# Patient Record
Sex: Female | Born: 1975 | Race: Black or African American | Hispanic: No | Marital: Single | Smoking: Never smoker
Health system: Southern US, Community
[De-identification: ages and names within clinical notes are randomized; demographics above are authoritative.]

---

## 2016-11-13 ENCOUNTER — Encounter: Payer: Self-pay | Admitting: *Deleted

## 2016-11-13 ENCOUNTER — Emergency Department: Payer: 59

## 2016-11-13 ENCOUNTER — Emergency Department
Admission: EM | Admit: 2016-11-13 | Discharge: 2016-11-14 | Disposition: A | Discharge status: Home / Self Care | Payer: 59 | Attending: Emergency Medicine | Admitting: Emergency Medicine

## 2016-11-13 DIAGNOSIS — N390 Urinary tract infection, site not specified: Secondary | ICD-10-CM | POA: Insufficient documentation

## 2016-11-13 DIAGNOSIS — R1031 Right lower quadrant pain: Secondary | ICD-10-CM | POA: Insufficient documentation

## 2016-11-13 DIAGNOSIS — N200 Calculus of kidney: Secondary | ICD-10-CM | POA: Diagnosis not present

## 2016-11-13 DIAGNOSIS — R109 Unspecified abdominal pain: Secondary | ICD-10-CM

## 2016-11-13 LAB — COMPREHENSIVE METABOLIC PANEL
ALT: 12 U/L — ABNORMAL LOW (ref 14–54)
ANION GAP: 9 (ref 5–15)
AST: 16 U/L (ref 15–41)
Albumin: 3.9 g/dL (ref 3.5–5.0)
Alkaline Phosphatase: 93 U/L (ref 38–126)
BUN: 13 mg/dL (ref 6–20)
CHLORIDE: 105 mmol/L (ref 101–111)
CO2: 23 mmol/L (ref 22–32)
Calcium: 9 mg/dL (ref 8.9–10.3)
Creatinine, Ser: 0.64 mg/dL (ref 0.44–1.00)
GFR calc non Af Amer: >60 mL/min (ref >60)
Glucose, Bld: 87 mg/dL (ref 65–99)
POTASSIUM: 3.6 mmol/L (ref 3.5–5.1)
SODIUM: 137 mmol/L (ref 135–145)
TOTAL PROTEIN: 7.8 g/dL (ref 6.5–8.1)
Total Bilirubin: 0.7 mg/dL (ref 0.3–1.2)

## 2016-11-13 LAB — URINALYSIS, COMPLETE (UACMP) WITH MICROSCOPIC
BILIRUBIN URINE: NEGATIVE
GLUCOSE, UA: NEGATIVE mg/dL
Ketones, ur: NEGATIVE mg/dL
LEUKOCYTES UA: NEGATIVE
NITRITE: POSITIVE — AB
PROTEIN: 30 mg/dL — AB
SPECIFIC GRAVITY, URINE: 1.03 (ref 1.005–1.030)
pH: 5 (ref 5.0–8.0)

## 2016-11-13 LAB — CBC
HEMATOCRIT: 33.2 % — AB (ref 35.0–47.0)
HEMOGLOBIN: 11 g/dL — AB (ref 12.0–16.0)
MCH: 25.2 pg — AB (ref 26.0–34.0)
MCHC: 33.2 g/dL (ref 32.0–36.0)
MCV: 75.7 fL — ABNORMAL LOW (ref 80.0–100.0)
Platelets: 230 10*3/uL (ref 150–440)
RBC: 4.38 MIL/uL (ref 3.80–5.20)
RDW: 17.2 % — AB (ref 11.5–14.5)
WBC: 6.4 10*3/uL (ref 3.6–11.0)

## 2016-11-13 LAB — LIPASE, BLOOD: Lipase: 20 U/L (ref 11–51)

## 2016-11-13 LAB — POCT PREGNANCY, URINE: PREG TEST UR: NEGATIVE

## 2016-11-13 MED ORDER — CEPHALEXIN 500 MG PO CAPS
500.0000 mg | ORAL_CAPSULE | Freq: Once | ORAL | Status: AC
  Administered 2016-11-14: 500 mg via ORAL
  Filled 2016-11-13: qty 1

## 2016-11-13 MED ORDER — CEPHALEXIN 500 MG PO CAPS: 500.0000 mg | ORAL_CAPSULE | Freq: Two times a day (BID) | ORAL | 0 refills | Status: AC

## 2016-11-13 MED ORDER — DOCUSATE SODIUM 100 MG PO CAPS: ORAL_CAPSULE | ORAL | 0 refills | Status: AC

## 2016-11-13 MED ORDER — HYDROCODONE-ACETAMINOPHEN 5-325 MG PO TABS: 1.0000 | ORAL_TABLET | ORAL | 0 refills | Status: AC | PRN

## 2016-11-13 NOTE — ED Notes (Signed)
Lab at bedside to attempt to collect blood.

## 2016-11-13 NOTE — Discharge Instructions (Addendum)
You have been seen in the Emergency Department (ED) today for pain that we believe based on your workup, is caused by kidney stones.  As we have discussed, please drink plenty of fluids.  Please make a follow up appointment with the physician(s) listed elsewhere in this documentation. It is also very important that you complete your full course of antibiotics.  You may take pain medication as needed but ONLY as prescribed.  Please also take your prescribed Flomax daily.  We also recommend that you take over-the-counter ibuprofen regularly according to label instructions over the next 5 days.  Take it with meals to minimize stomach discomfort.  Please see your doctor as soon as possible as stones may take 1-3 weeks to pass and you may require additional care or medications.  Do not drink alcohol, drive or participate in any other potentially dangerous activities while taking opiate pain medication as it may make you sleepy. Do not take this medication with any other sedating medications, either prescription or over-the-counter. If you were prescribed Percocet or Vicodin, do not take these with acetaminophen (Tylenol) as it is already contained within these medications.   Take Norco as needed for severe pain.  This medication is an opiate (or narcotic) pain medication and can be habit forming.  Use it as little as possible to achieve adequate pain control.  Do not use or use it with extreme caution if you have a history of opiate abuse or dependence.  If you are on a pain contract with your primary care doctor or a pain specialist, be sure to let them know you were prescribed this medication today from the [facility].  This medication is intended for your use only - do not give any to anyone else and keep it in a secure place where nobody else, especially children, have access to it.  It will also cause or worsen constipation, so you may want to consider taking an over-the-counter  stool softener while you are taking this medication.  Return to the Emergency Department (ED) or call your doctor if you have any worsening pain, fever, painful urination, are unable to urinate, or develop other symptoms that concern you.

## 2016-11-13 NOTE — ED Triage Notes (Signed)
Pt to lobby via w/c with no distress noted, brought in by EMS; EMS reports pt was at work, sudden onset at 545pm and right lower abd pain accomp by N/V

## 2016-11-13 NOTE — ED Notes (Signed)
Pt states N&V & RLQ pain that began at work. States she hasn't vomiting since work. Pt states she still has all abdominal organs. Denies seeing any blood in vomit, stool, or urine. Alert, oriented, no distress noted at this time. Family member at bedside.

## 2016-11-13 NOTE — ED Provider Notes (Addendum)
[facility] Emergency Department Provider Note  ____________________________________________   First MD Initiated Contact with Patient 11/13/16 2200     (approximate)  I have reviewed the triage vital signs and the nursing notes.   HISTORY  Chief Complaint Abdominal Pain    HPI Meghan Spence is a 41 y.o. female without any chronic medical issueswho presents by private vehicle for evaluation of acute onset nausea and several episodes of vomiting with sharp cramping pain in her right lower quadrant.  She states that she was at work and in her usual state of health when the symptoms started very suddenly and were severe.  They waxed and waned over the next couple of hours.  They have currently resolved.  She has never experienced anything like it in the past.  She states that while it was present she cannot find a position of comfort, but now she feels fine.  She has not had any recent dysuria or hematuria and has no history of kidney stones.  Her last menstrual period was about 3 weeks ago and she has not had any vaginal bleeding.  She denies fever/chills, chest pain or shortness of breath, diarrhea.  She is currently in no acute distress.   No past medical history on file.  There are no active problems to display for this patient.   No past surgical history on file.  Prior to Admission medications   Medication Sig Start Date End Date Taking? Authorizing Provider  cephALEXin (KEFLEX) 500 MG capsule Take 1 capsule (500 mg total) by mouth 2 (two) times daily. 11/13/16   Loleta Rose, MD  docusate sodium (COLACE) 100 MG capsule Take 1 tablet once or twice daily as needed for constipation while taking narcotic pain medicine 11/13/16   Loleta Rose, MD  HYDROcodone-acetaminophen (NORCO/VICODIN) 5-325 MG tablet Take 1-2 tablets by mouth every 4 (four) hours as needed for moderate pain. 11/13/16   Loleta Rose, MD  ondansetron (ZOFRAN ODT) 4 MG disintegrating tablet  Allow 1-2 tablets to dissolve in your mouth every 8 hours as needed for nausea/vomiting 11/14/16   Loleta Rose, MD    Allergies Patient has no known allergies.  No family history on file.  Social History Social History  Substance Use Topics  . Smoking status: Never Smoker  . Smokeless tobacco: Never Used  . Alcohol use No    Review of Systems Constitutional: No fever/chills Eyes: No visual changes. ENT: No sore throat. Cardiovascular: Denies chest pain. Respiratory: Denies shortness of breath. Gastrointestinal: Acute onset severe right lower quadrant abdominal pain with several episodes of vomiting and nausea, all of which have now resolved.  No diarrhea.  No constipation. Genitourinary: Negative for dysuria. Musculoskeletal: Negative for neck pain.  Negative for back pain. Integumentary: Negative for rash. Neurological: Negative for headaches, focal weakness or numbness.   ____________________________________________   PHYSICAL EXAM:  VITAL SIGNS: ED Triage Vitals  Enc Vitals Group     BP 11/13/16 2037 137/81     Pulse Rate 11/13/16 2037 67     Resp 11/13/16 2037 20     Temp 11/13/16 2037 98.6 F (37 C)     Temp Source 11/13/16 2037 Oral     SpO2 11/13/16 2037 100 %     Weight 11/13/16 2037 118.8 kg (262 lb)     Height 11/13/16 2037 1.549 m (5\' 1" )     Head Circumference --      Peak Flow --      Pain Score 11/13/16  2036 5     Pain Loc --      Pain Edu? --      Excl. in GC? --     Constitutional: Alert and oriented. Well appearing and in no acute distress. Eyes: Conjunctivae are normal.  Head: Atraumatic. Nose: No congestion/rhinnorhea. Mouth/Throat: Mucous membranes are moist. Neck: No stridor.  No meningeal signs.   Cardiovascular: Normal rate, regular rhythm. Good peripheral circulation. Grossly normal heart sounds. Respiratory: Normal respiratory effort.  No retractions. Lungs CTAB. Gastrointestinal: Obese.  Soft with very minimal tenderness to  palpation of the right lower quadrant, no rebound and no guarding.  No CVA tenderness on either side. GU:  Deferred Musculoskeletal: No lower extremity tenderness nor edema. No gross deformities of extremities. Neurologic:  Normal speech and language. No gross focal neurologic deficits are appreciated.  Skin:  Skin is warm, dry and intact. No rash noted. Psychiatric: Mood and affect are normal. Speech and behavior are normal.  ____________________________________________   LABS (all labs ordered are listed, but only abnormal results are displayed)  Labs Reviewed  URINALYSIS, COMPLETE (UACMP) WITH MICROSCOPIC - Abnormal; Notable for the following:       Result Value   Color, Urine AMBER (*)    APPearance HAZY (*)    Hgb urine dipstick MODERATE (*)    Protein, ur 30 (*)    Nitrite POSITIVE (*)    Bacteria, UA MANY (*)    Squamous Epithelial / LPF 0-5 (*)    All other components within normal limits  CBC - Abnormal; Notable for the following:    Hemoglobin 11.0 (*)    HCT 33.2 (*)    MCV 75.7 (*)    MCH 25.2 (*)    RDW 17.2 (*)    All other components within normal limits  COMPREHENSIVE METABOLIC PANEL - Abnormal; Notable for the following:    ALT 12 (*)    All other components within normal limits  URINE CULTURE  LIPASE, BLOOD  POC URINE PREG, ED  POCT PREGNANCY, URINE   ____________________________________________  EKG  None - EKG not ordered by ED physician ____________________________________________  RADIOLOGY   Ct Renal Stone Study  Result Date: 11/13/2016 CLINICAL DATA:  Acute onset RIGHT lower quadrant pain at work, nausea and vomiting. EXAM: CT ABDOMEN AND PELVIS WITHOUT CONTRAST TECHNIQUE: Multidetector CT imaging of the abdomen and pelvis was performed following the standard protocol without IV contrast. COMPARISON:  None. FINDINGS: LOWER CHEST: 3 mm RIGHT lower lobe sub solid pulmonary nodule (series 4, image 2/24), no routine follow-up. The visualized heart  size is normal. No pericardial effusion. HEPATOBILIARY: Normal. PANCREAS: Normal. SPLEEN: Normal. ADRENALS/URINARY TRACT: Kidneys are orthotopic, demonstrating normal size and morphology. No nephrolithiasis, hydronephrosis; limited assessment for renal masses on this nonenhanced examination. The unopacified ureters are normal in course and caliber. Punctate calcification posterior to the RIGHT ureter. Additional punctate potential distal RIGHT ureteral calculus (coronal 85/151). Urinary bladder is partially distended and unremarkable. Normal adrenal glands. STOMACH/BOWEL: The stomach, small and large bowel are normal in course and caliber without inflammatory changes, sensitivity decreased by lack of enteric contrast. Normal appendix. VASCULAR/LYMPHATIC: Aortoiliac vessels are normal in course and caliber. No lymphadenopathy by CT size criteria. REPRODUCTIVE: 4.1 cm benign-appearing LEFT adnexal cyst. OTHER: No intraperitoneal free fluid or free air. Phleboliths in the pelvis. MUSCULOSKELETAL: Non-acute.  Small fat containing umbilical hernia. IMPRESSION: 1. Punctate suspected nonobstructing distal RIGHT ureteral calculus. No nephrolithiasis or obstructive uropathy. 2. Normal appendix. Electronically Signed   By: Pernell Dupre  Bloomer M.D.   On: 11/13/2016 23:05    ____________________________________________   PROCEDURES  Critical Care performed: No   Procedure(s) performed:   Procedures   ____________________________________________   INITIAL IMPRESSION / ASSESSMENT AND PLAN / ED COURSE  Pertinent labs & imaging results that were available during my care of the patient were reviewed by me and considered in my medical decision making (see chart for details).  The acuity of the symptoms and the way that she describes it, including not being able to find a position of comfort, suggest to me that this may be renal colic.  Ovarian torsion could present similarly but based on the description of the  pain, the location, and the fact that it is completely resolved, I find ureterolithiasis to be much more likely.  Given that she has a urinary tract infection, I feel it is important to obtain a CT scan to make sure that not only is the diagnosis of kidney stones most likely, but that she does not have an infected or impacted stone.  She agrees with the current plan.   Clinical Course as of Nov 15 22  Wed Nov 13, 2016  2344 Discussed results with patient.  I feel that she most likely passed a stone given that her symptoms have clearly resolved, even though she has a couple of persistent and not obstructing stones.  I had my usual customary kidney stone discussion with her.  I will give her a first dose of antibiotics here and a prescription to fill tomorrow.  I will also give her narcotics and Zofran but encouraged her to only use them eventually necessary.  She will follow up with her primary care doctor.  I gave my usual and customary return precautions.    CT Renal Soundra PilonStone Study [CF]    Clinical Course User Index [CF] Loleta RoseForbach, Yaqueline Gutter, MD    ____________________________________________  FINAL CLINICAL IMPRESSION(S) / ED DIAGNOSES  Final diagnoses:  Right sided abdominal pain  Kidney stones  Urinary tract infection without hematuria, site unspecified  Urinary Tract Infection   MEDICATIONS GIVEN DURING THIS VISIT:  Medications  cephALEXin (KEFLEX) capsule 500 mg (500 mg Oral Given 11/14/16 0015)     NEW OUTPATIENT MEDICATIONS STARTED DURING THIS VISIT:  New Prescriptions   CEPHALEXIN (KEFLEX) 500 MG CAPSULE    Take 1 capsule (500 mg total) by mouth 2 (two) times daily.   DOCUSATE SODIUM (COLACE) 100 MG CAPSULE    Take 1 tablet once or twice daily as needed for constipation while taking narcotic pain medicine   HYDROCODONE-ACETAMINOPHEN (NORCO/VICODIN) 5-325 MG TABLET    Take 1-2 tablets by mouth every 4 (four) hours as needed for moderate pain.   ONDANSETRON (ZOFRAN ODT) 4 MG  DISINTEGRATING TABLET    Allow 1-2 tablets to dissolve in your mouth every 8 hours as needed for nausea/vomiting    Modified Medications   No medications on file    Discontinued Medications   No medications on file     Note:  This document was prepared using Dragon voice recognition software and may include unintentional dictation errors.    Loleta RoseForbach, Versia Mignogna, MD 11/14/16 Aretha [id number]    Loleta RoseForbach, Anasophia Pecor, MD 11/14/16 [phone]

## 2016-11-13 NOTE — ED Notes (Signed)
Dr. Forbach at bedside.  

## 2016-11-13 NOTE — ED Triage Notes (Signed)
Pt to triage via wheelchair.  Pt has low abd pain with nausea.  No vag bleeding  No urinary sx.  No back pain.  Pt alert   Speech clear.

## 2016-11-14 MED ORDER — ONDANSETRON 4 MG PO TBDP: ORAL_TABLET | ORAL | 0 refills | Status: AC

## 2016-11-14 NOTE — ED Notes (Signed)

## 2016-11-16 LAB — URINE CULTURE: Special Requests: NORMAL

## 2018-09-26 IMAGING — CT CT RENAL STONE PROTOCOL
2 of 4 series · 16 of 46 positions shown, 18 images · non-contrast
Comparison: None.

CLINICAL DATA: Acute onset RIGHT lower quadrant pain at work,
nausea and vomiting.

EXAM:
CT ABDOMEN AND PELVIS WITHOUT CONTRAST
TECHNIQUE: Multidetector CT imaging of the abdomen and pelvis was performed
following the standard protocol without IV contrast.

[Series 2: stone full standard · axial · 0.90mm/px · z∈[-64,+331]mm · 13 of 87 slices shown, 15 images]
[im 4/87  soft-tissue]
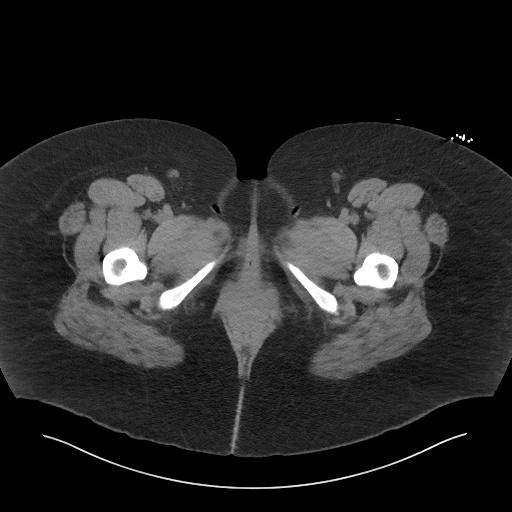
[im 4/87  bone]
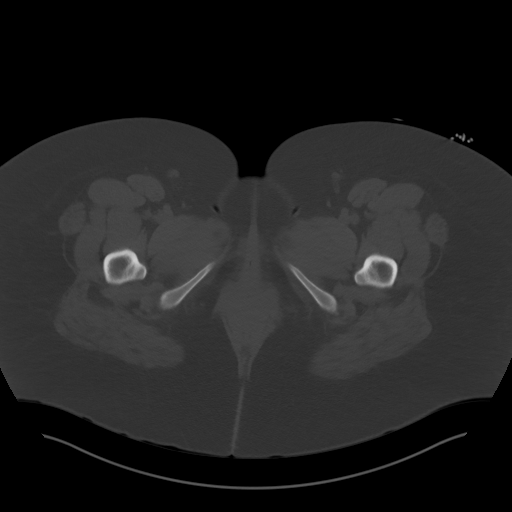
[im 11/87  soft-tissue]
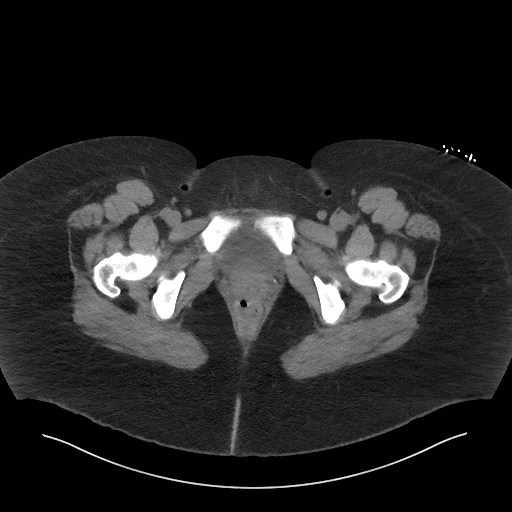
[im 18/87  soft-tissue]
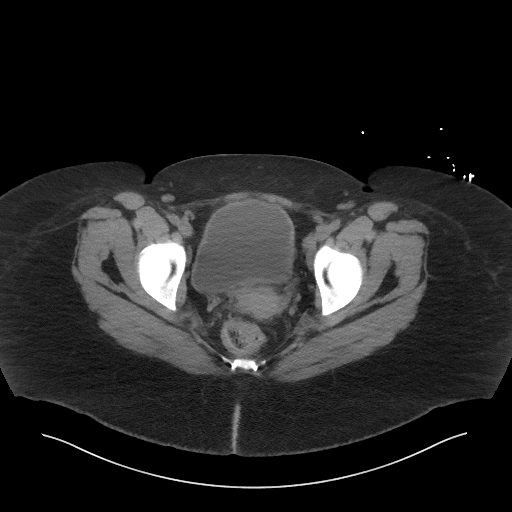
[im 26/87  soft-tissue]
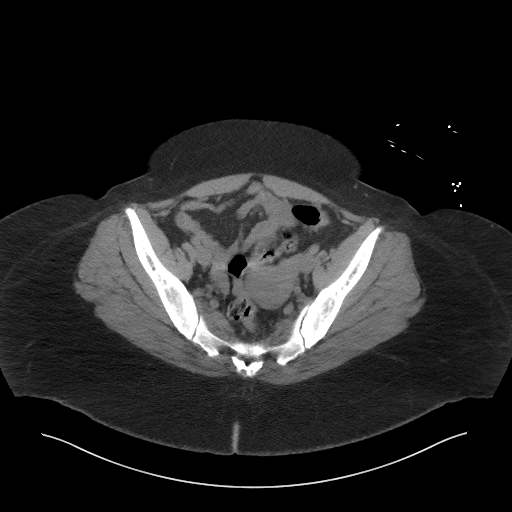
[im 29/87  soft-tissue]
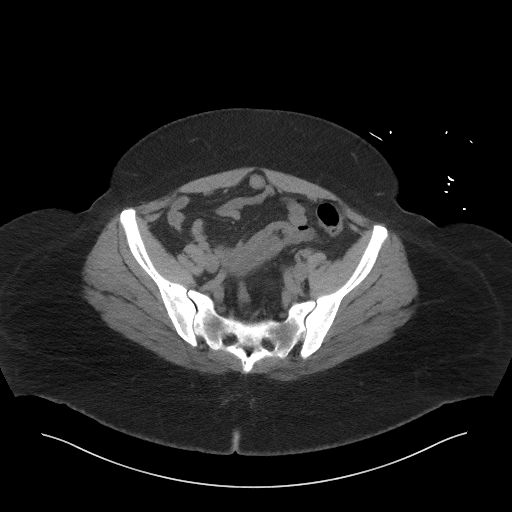
[im 36/87  soft-tissue]
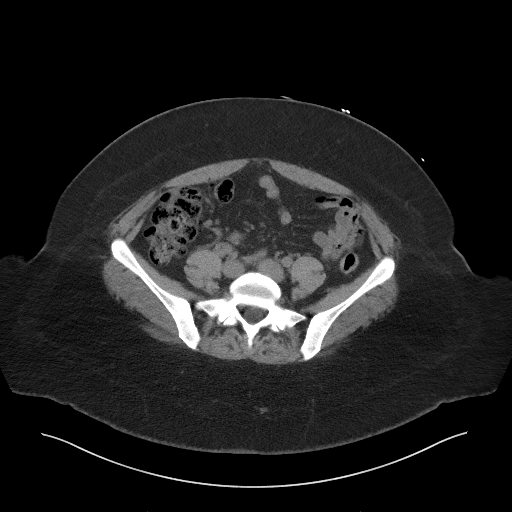
[im 44/87  soft-tissue]
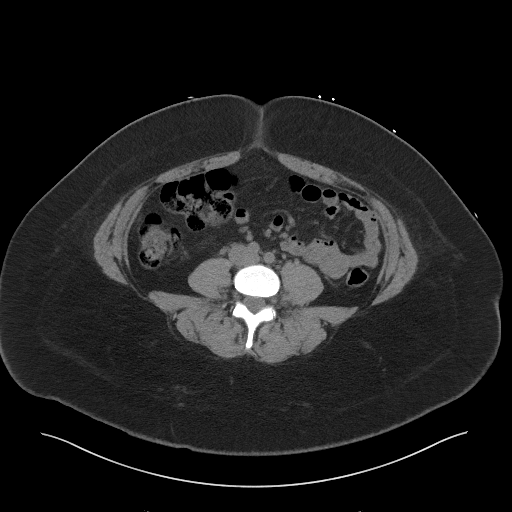
[im 51/87  soft-tissue]
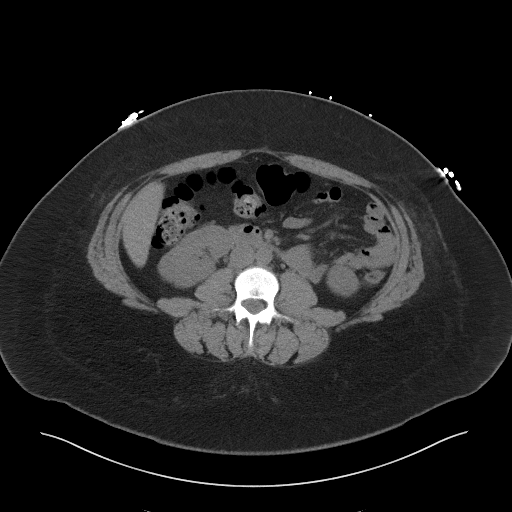
[im 58/87  soft-tissue]
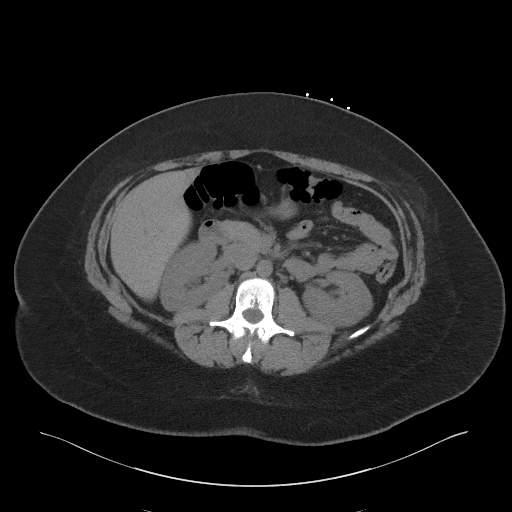
[im 58/87  bone]
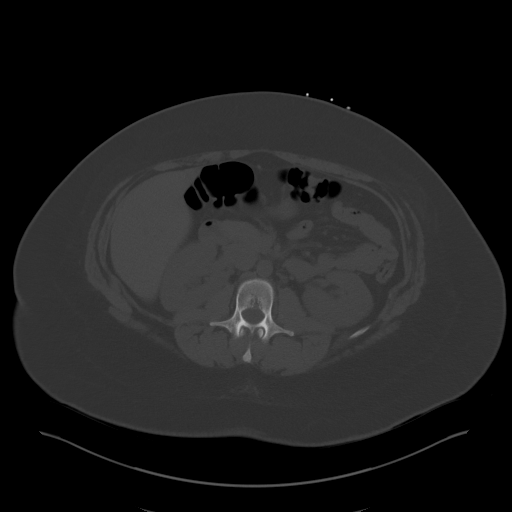
[im 61/87  soft-tissue]
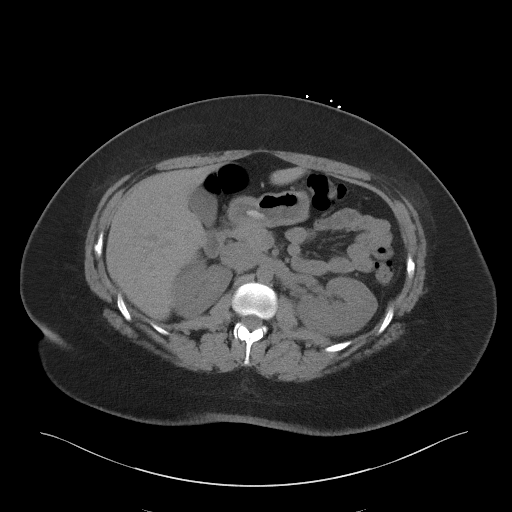
[im 69/87  soft-tissue]
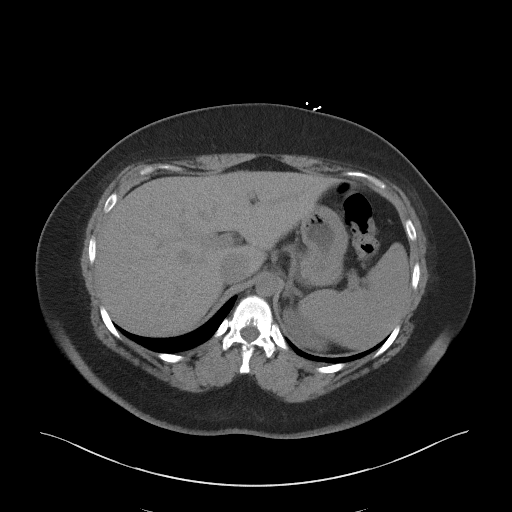
[im 76/87  soft-tissue]
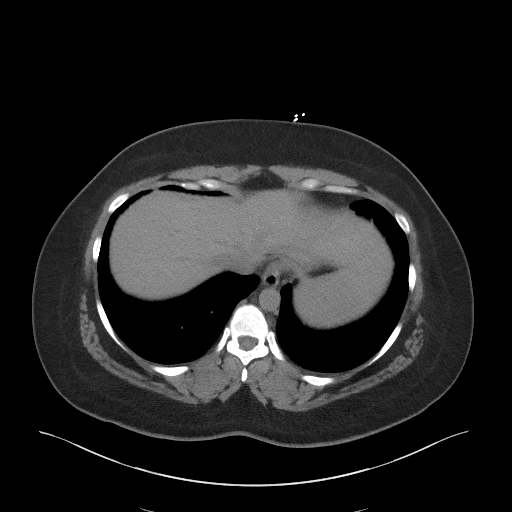
[im 83/87  soft-tissue]
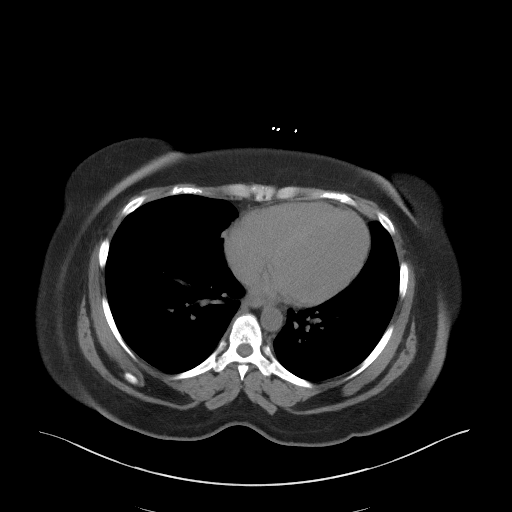

[Series 5: coronal · coronal · 0.78mm/px · 3 of 151 slices shown]
[im 51/151  soft-tissue]
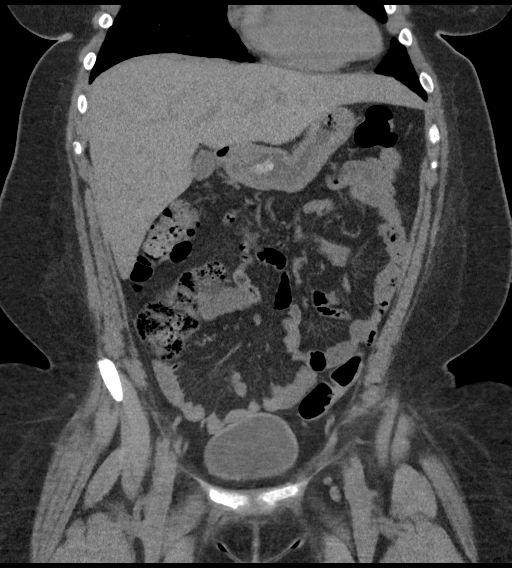
[im 67/151  soft-tissue]
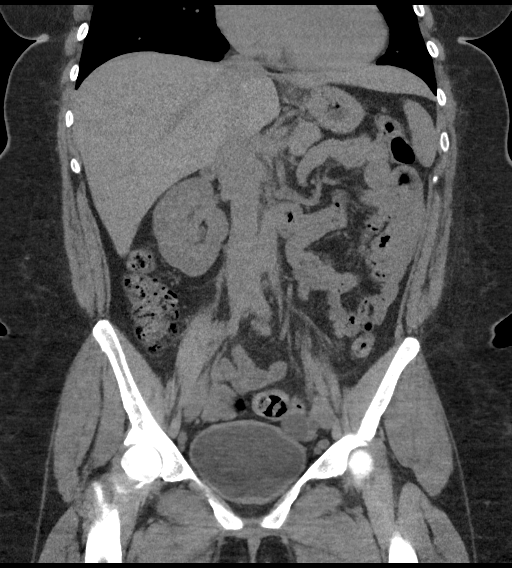
[im 84/151  soft-tissue]
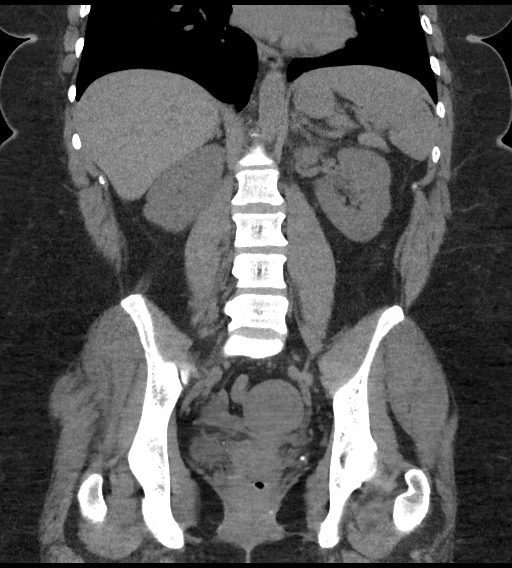

[16 of 46 positions shown; findings below may reference images not displayed]

FINDINGS: LOWER CHEST: 3 mm RIGHT lower lobe sub solid pulmonary nodule
(series 4, image [DATE]), no routine follow-up. The visualized heart
size is normal. No pericardial effusion.

HEPATOBILIARY: Normal.

PANCREAS: Normal.

SPLEEN: Normal.

ADRENALS/URINARY TRACT: Kidneys are orthotopic, demonstrating normal
size and morphology. No nephrolithiasis, hydronephrosis; limited
assessment for renal masses on this nonenhanced examination. The
unopacified ureters are normal in course and caliber. Punctate
calcification posterior to the RIGHT ureter. Additional punctate
potential distal RIGHT ureteral calculus (coronal 85/151). Urinary
bladder is partially distended and unremarkable. Normal adrenal
glands.

STOMACH/BOWEL: The stomach, small and large bowel are normal in
course and caliber without inflammatory changes, sensitivity
decreased by lack of enteric contrast. Normal appendix.

VASCULAR/LYMPHATIC: Aortoiliac vessels are normal in course and
caliber. No lymphadenopathy by CT size criteria.

REPRODUCTIVE: 4.1 cm benign-appearing LEFT adnexal cyst.

OTHER: No intraperitoneal free fluid or free air. Phleboliths in the
pelvis.

MUSCULOSKELETAL: Non-acute.  Small fat containing umbilical hernia.
IMPRESSION: 1. Punctate suspected nonobstructing distal RIGHT ureteral calculus.
No nephrolithiasis or obstructive uropathy.
2. Normal appendix.

## 2019-05-27 ENCOUNTER — Ambulatory Visit: Payer: Self-pay | Attending: Internal Medicine

## 2019-05-27 DIAGNOSIS — Z23 Encounter for immunization: Secondary | ICD-10-CM

## 2019-05-27 NOTE — Progress Notes (Signed)
   Covid-19 Vaccination Clinic  Name:  Meghan Spence    MRN: [id number] DOB: 1975-12-08  05/27/2019  [name] was observed post Covid-19 immunization for 15 minutes without incident. She was provided with Vaccine Information Sheet and instruction to access the V-Safe system.   [name] was instructed to call 911 with any severe reactions post vaccine: Marland Kitchen Difficulty breathing  . Swelling of face and throat  . A fast heartbeat  . A bad rash all over body  . Dizziness and weakness   Immunizations Administered    Name Date Dose VIS Date Route   Pfizer COVID-19 Vaccine 05/27/2019  8:43 AM 0.3 mL 02/19/2019 Intramuscular   Manufacturer: [employer], [employer]   Lot: [id number]   NDC: [address]-4

## 2019-06-22 ENCOUNTER — Ambulatory Visit: Payer: Self-pay | Attending: Internal Medicine

## 2019-06-22 DIAGNOSIS — Z23 Encounter for immunization: Secondary | ICD-10-CM

## 2019-06-22 NOTE — Progress Notes (Signed)
   Covid-19 Vaccination Clinic  Name:  Meghan Spence    MRN: [id number] DOB: 1975/08/15  06/22/2019  [name] was observed post Covid-19 immunization for 15 minutes without incident. She was provided with Vaccine Information Sheet and instruction to access the V-Safe system.   [name] was instructed to call 911 with any severe reactions post vaccine: Marland Kitchen Difficulty breathing  . Swelling of face and throat  . A fast heartbeat  . A bad rash all over body  . Dizziness and weakness   Immunizations Administered    Name Date Dose VIS Date Route   Pfizer COVID-19 Vaccine 06/22/2019  8:45 AM 0.3 mL 02/19/2019 Intramuscular   Manufacturer: [employer], [employer]   Lot: [id number]   NDC: [address]-0
